# Patient Record
Sex: Female | Born: 1985 | Race: Black or African American | Hispanic: No | Marital: Married | Smoking: Never smoker
Health system: Southern US, Community
[De-identification: ages and names within clinical notes are randomized; demographics above are authoritative.]

---

## 2017-10-19 ENCOUNTER — Emergency Department (HOSPITAL_BASED_OUTPATIENT_CLINIC_OR_DEPARTMENT_OTHER)
Admission: EM | Admit: 2017-10-19 | Discharge: 2017-10-19 | Disposition: A | Discharge status: Home / Self Care | Payer: Managed Care, Other (non HMO) | Attending: Emergency Medicine | Admitting: Emergency Medicine

## 2017-10-19 ENCOUNTER — Other Ambulatory Visit: Payer: Self-pay

## 2017-10-19 ENCOUNTER — Emergency Department (HOSPITAL_BASED_OUTPATIENT_CLINIC_OR_DEPARTMENT_OTHER): Payer: Managed Care, Other (non HMO)

## 2017-10-19 ENCOUNTER — Encounter (HOSPITAL_BASED_OUTPATIENT_CLINIC_OR_DEPARTMENT_OTHER): Payer: Self-pay

## 2017-10-19 DIAGNOSIS — R109 Unspecified abdominal pain: Secondary | ICD-10-CM

## 2017-10-19 DIAGNOSIS — R51 Headache: Secondary | ICD-10-CM | POA: Diagnosis present

## 2017-10-19 DIAGNOSIS — Z91040 Latex allergy status: Secondary | ICD-10-CM | POA: Insufficient documentation

## 2017-10-19 DIAGNOSIS — R1011 Right upper quadrant pain: Secondary | ICD-10-CM | POA: Insufficient documentation

## 2017-10-19 DIAGNOSIS — G43809 Other migraine, not intractable, without status migrainosus: Secondary | ICD-10-CM

## 2017-10-19 LAB — CBC WITH DIFFERENTIAL/PLATELET
Basophils Absolute: 0 10*3/uL (ref 0.0–0.1)
Basophils Relative: 0 %
EOS ABS: 0.1 10*3/uL (ref 0.0–0.7)
EOS PCT: 1 %
HCT: 36.6 % (ref 36.0–46.0)
Hemoglobin: 12.3 g/dL (ref 12.0–15.0)
LYMPHS ABS: 1.4 10*3/uL (ref 0.7–4.0)
LYMPHS PCT: 15 %
MCH: 28.1 pg (ref 26.0–34.0)
MCHC: 33.6 g/dL (ref 30.0–36.0)
MCV: 83.8 fL (ref 78.0–100.0)
MONOS PCT: 4 %
Monocytes Absolute: 0.4 10*3/uL (ref 0.1–1.0)
Neutro Abs: 8 10*3/uL — ABNORMAL HIGH (ref 1.7–7.7)
Neutrophils Relative %: 80 %
PLATELETS: 257 10*3/uL (ref 150–400)
RBC: 4.37 MIL/uL (ref 3.87–5.11)
RDW: 13 % (ref 11.5–15.5)
WBC: 9.9 10*3/uL (ref 4.0–10.5)

## 2017-10-19 LAB — URINALYSIS, ROUTINE W REFLEX MICROSCOPIC
Bilirubin Urine: NEGATIVE
GLUCOSE, UA: NEGATIVE mg/dL
HGB URINE DIPSTICK: NEGATIVE
KETONES UR: NEGATIVE mg/dL
Leukocytes, UA: NEGATIVE
Nitrite: NEGATIVE
PROTEIN: NEGATIVE mg/dL
Specific Gravity, Urine: 1.02 (ref 1.005–1.030)
pH: 6.5 (ref 5.0–8.0)

## 2017-10-19 LAB — COMPREHENSIVE METABOLIC PANEL
ALK PHOS: 63 U/L (ref 38–126)
ALT: 17 U/L (ref 14–54)
ANION GAP: 7 (ref 5–15)
AST: 24 U/L (ref 15–41)
Albumin: 3.9 g/dL (ref 3.5–5.0)
BUN: 14 mg/dL (ref 6–20)
CALCIUM: 8.9 mg/dL (ref 8.9–10.3)
CHLORIDE: 106 mmol/L (ref 101–111)
CO2: 23 mmol/L (ref 22–32)
Creatinine, Ser: 0.72 mg/dL (ref 0.44–1.00)
Glucose, Bld: 96 mg/dL (ref 65–99)
Potassium: 4.3 mmol/L (ref 3.5–5.1)
SODIUM: 136 mmol/L (ref 135–145)
Total Bilirubin: 0.6 mg/dL (ref 0.3–1.2)
Total Protein: 8.2 g/dL — ABNORMAL HIGH (ref 6.5–8.1)

## 2017-10-19 LAB — PREGNANCY, URINE: PREG TEST UR: NEGATIVE

## 2017-10-19 LAB — LIPASE, BLOOD: LIPASE: 32 U/L (ref 11–51)

## 2017-10-19 MED ORDER — PROCHLORPERAZINE EDISYLATE 10 MG/2ML IJ SOLN
10.0000 mg | Freq: Once | INTRAMUSCULAR | Status: DC
  Filled 2017-10-19: qty 2

## 2017-10-19 MED ORDER — DEXAMETHASONE SODIUM PHOSPHATE 10 MG/ML IJ SOLN
10.0000 mg | Freq: Once | INTRAMUSCULAR | Status: AC
  Administered 2017-10-19: 10 mg via INTRAVENOUS
  Filled 2017-10-19 (×2): qty 1

## 2017-10-19 MED ORDER — SODIUM CHLORIDE 0.9 % IV BOLUS
1000.0000 mL | Freq: Once | INTRAVENOUS | Status: AC
Start: 2017-10-19 — End: 2017-10-19
  Administered 2017-10-19: 1000 mL via INTRAVENOUS

## 2017-10-19 MED ORDER — DIPHENHYDRAMINE HCL 50 MG/ML IJ SOLN
25.0000 mg | Freq: Once | INTRAMUSCULAR | Status: DC
  Filled 2017-10-19: qty 1

## 2017-10-19 MED ORDER — ONDANSETRON HCL 4 MG/2ML IJ SOLN
4.0000 mg | Freq: Once | INTRAMUSCULAR | Status: AC
  Administered 2017-10-19: 4 mg via INTRAVENOUS
  Filled 2017-10-19: qty 2

## 2017-10-19 MED ORDER — ACETAMINOPHEN 500 MG PO TABS
1000.0000 mg | ORAL_TABLET | Freq: Once | ORAL | Status: AC
  Administered 2017-10-19: 1000 mg via ORAL
  Filled 2017-10-19: qty 2

## 2017-10-19 MED ORDER — ONDANSETRON 8 MG PO TBDP: 8.0000 mg | ORAL_TABLET | Freq: Three times a day (TID) | ORAL | 0 refills | Status: AC | PRN

## 2017-10-19 NOTE — ED Provider Notes (Signed)
[facility] EMERGENCY DEPARTMENT Provider Note   CSN: [id number] Arrival date & time: 10/19/17  1545     History   Chief Complaint Chief Complaint  Patient presents with  . Migraine    HPI Jo Obrien is a 32 y.o. female.  HPI Jo Obrien is a 32 y.o. female presents to emergency department complaining of a headache.  Patient states that she started having a typical for her migraine yesterday.  States took Tylenol which did not help.  This morning she went and while at work, developed right upper quadrant abdominal pain, nausea, vomiting.  She states she is still having a headache as well.  No medications taken prior to coming in today.  EMS had to be called to transport patient here.  Denies any vaginal discharge or bleeding.  Denies any urinary symptoms.  No fever or chills.  States that she is having generalized weakness and generalized malaise.  History reviewed. No pertinent past medical history.  There are no active problems to display for this patient.   History reviewed. No pertinent surgical history.   OB History   None      Home Medications    Prior to Admission medications   Not on File    Family History No family history on file.  Social History Social History   Tobacco Use  . Smoking status: Never Smoker  . Smokeless tobacco: Never Used  Substance Use Topics  . Alcohol use: Not Currently  . Drug use: Not Currently     Allergies   Ibuprofen; Latex; and Sulfa antibiotics   Review of Systems Review of Systems  Constitutional: Negative for chills and fever.  Respiratory: Negative for cough, chest tightness and shortness of breath.   Cardiovascular: Negative for chest pain, palpitations and leg swelling.  Gastrointestinal: Positive for abdominal pain, nausea and vomiting. Negative for diarrhea.  Genitourinary: Negative for dysuria, flank pain, pelvic pain, vaginal bleeding, vaginal discharge and vaginal pain.  Musculoskeletal:  Negative for arthralgias, myalgias, neck pain and neck stiffness.  Skin: Negative for rash.  Neurological: Positive for headaches. Negative for dizziness and weakness.  All other systems reviewed and are negative.    Physical Exam Updated Vital Signs BP 127/89 (BP Location: Right Arm)   Pulse 86   Temp 98.1 F (36.7 C) (Oral)   Resp 18   Ht 5\' 4"  (1.626 m)   Wt 104.3 kg (230 lb)   SpO2 99%   BMI 39.48 kg/m   Physical Exam  Constitutional: She is oriented to person, place, and time. She appears well-developed and well-nourished. No distress.  HENT:  Head: Normocephalic and atraumatic.  Eyes: Pupils are equal, round, and reactive to light. Conjunctivae and EOM are normal.  Neck: Normal range of motion. Neck supple.  Cardiovascular: Normal rate, regular rhythm and normal heart sounds.  Pulmonary/Chest: Effort normal and breath sounds normal. No respiratory distress. She has no wheezes. She has no rales.  Abdominal: Soft. Bowel sounds are normal. She exhibits no distension. There is tenderness. There is no rebound.  Diffuse tenderness, worse in the right upper quadrant.  Musculoskeletal: Normal range of motion. She exhibits no edema.  Neurological: She is alert and oriented to person, place, and time. No cranial nerve deficit. Coordination normal.  Skin: Skin is warm and dry.  Psychiatric: She has a normal mood and affect. Her behavior is normal.  Nursing note and vitals reviewed.    ED Treatments / Results  Labs (all labs ordered are listed,  but only abnormal results are displayed) Labs Reviewed  CBC WITH DIFFERENTIAL/PLATELET - Abnormal; Notable for the following components:      Result Value   Neutro Abs 8.0 (*)    All other components within normal limits  COMPREHENSIVE METABOLIC PANEL - Abnormal; Notable for the following components:   Total Protein 8.2 (*)    All other components within normal limits  URINALYSIS, ROUTINE W REFLEX MICROSCOPIC - Abnormal; Notable for  the following components:   APPearance CLOUDY (*)    All other components within normal limits  LIPASE, BLOOD  PREGNANCY, URINE    EKG None  Radiology US Abdomen Limited Ruq  Result Date: 10/19/2017 CLINICAL DATA:  Right upper quadrant pain EXAM: ULTRASOUND ABDOMEN LIMITED RIGHT UPPER QUADRANT COMPARISON:  None. FINDINGS: Gallbladder: No gallstones or wall thickening visualized. No sonographic Murphy sign noted by sonographer. Common bile duct: Diameter: 2 mm Liver: No focal lesion identified. Within normal limits in parenchymal echogenicity. Portal vein is patent on color Doppler imaging with normal direction of blood flow towards the liver. IMPRESSION: No acute abnormality noted. Electronically Signed   By: Alcide Clever M.D.   On: 10/19/2017 19:29    Procedures Procedures (including critical care time)  Medications Ordered in ED Medications  prochlorperazine (COMPAZINE) injection 10 mg (has no administration in time range)  diphenhydrAMINE (BENADRYL) injection 25 mg (has no administration in time range)  dexamethasone (DECADRON) injection 10 mg (has no administration in time range)  sodium chloride 0.9 % bolus 1,000 mL (has no administration in time range)     Initial Impression / Assessment and Plan / ED Course  I have reviewed the triage vital signs and the nursing notes.  Pertinent labs & imaging results that were available during my care of the patient were reviewed by me and considered in my medical decision making (see chart for details).     Patient in emergency department with headache, states similar to prior migraines, also complaining of right upper quadrant abdominal pain, nausea, vomiting.  Vital signs are normal, afebrile, no acute distress.  Abdomen is diffusely tender, worse in the right upper quadrant.  Will check labs, give migraine cocktail, give fluids.  Will reassess.  Patient feels much better.  Headache has resolved.  Continue to have right upper quadrant  abdominal pain, ultrasound was obtained to rule out cholecystitis and is negative.  Normal LFTs and lipase.  Normal WBC.  Question whether abdominal pain could be from vomiting which could be caused by migraine headache.  Patient is feels much better, vital signs all within normal, will discharge home.  We will have her follow-up with family doctor.  Advised to take Tylenol or Excedrin Migraine for headache.  Return precautions discussed.  Vitals:   10/19/17 1557 10/19/17 1759 10/19/17 1841 10/19/17 1955  BP: 127/89 (!) 131/92 120/79 120/72  Pulse: 86 84 76 83  Resp: 18 18 16 16   Temp: 98.1 F (36.7 C)     TempSrc: Oral     SpO2: 99% 99% 96% 99%  Weight: 104.3 kg (230 lb)     Height: 5\' 4"  (1.626 m)        Final Clinical Impressions(s) / ED Diagnoses   Final diagnoses:  Abdominal pain  Other migraine without status migrainosus, not intractable    ED Discharge Orders        Ordered    ondansetron (ZOFRAN ODT) 8 MG disintegrating tablet  Every 8 hours PRN     10/19/17 1950  Jaynie Crumble, PA-C 10/19/17 2302    Maia Plan, MD 10/20/17 1103

## 2017-10-19 NOTE — ED Notes (Signed)
Pt refused medications at this time and stated that they made her feel like she was floating.  Angelique Holm. Kirichenko, PA notified.

## 2017-10-19 NOTE — ED Triage Notes (Signed)
Per EMS pt developed a migraine headache last night and this morning at work she became nausea and vomit x1 from the headache

## 2017-10-19 NOTE — Discharge Instructions (Addendum)
Zofran as prescribed as needed for nausea.  Your blood work and ultrasound today is reassuring.  Please follow-up with your doctor as needed.

## 2020-02-24 IMAGING — US US ABDOMEN LIMITED
1 series · 14 of 25 positions shown · non-contrast
Comparison: None.

CLINICAL DATA: Right upper quadrant pain

EXAM:
ULTRASOUND ABDOMEN LIMITED RIGHT UPPER QUADRANT

[Series 1: us abdomen limited · 0.18mm/px · 14 of 27 slices shown]
[im 1/27]
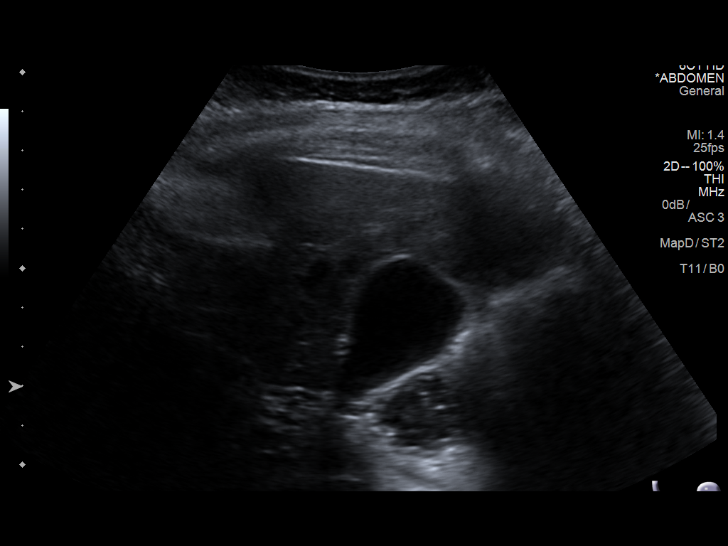
[im 3/27]
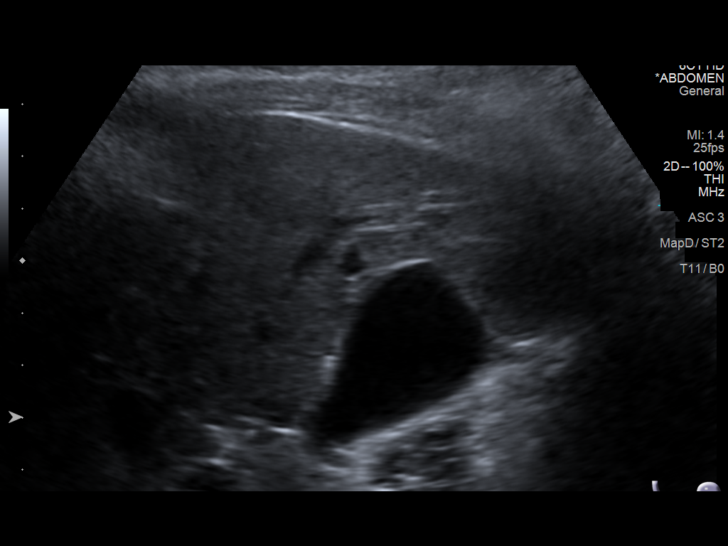
[im 5/27]
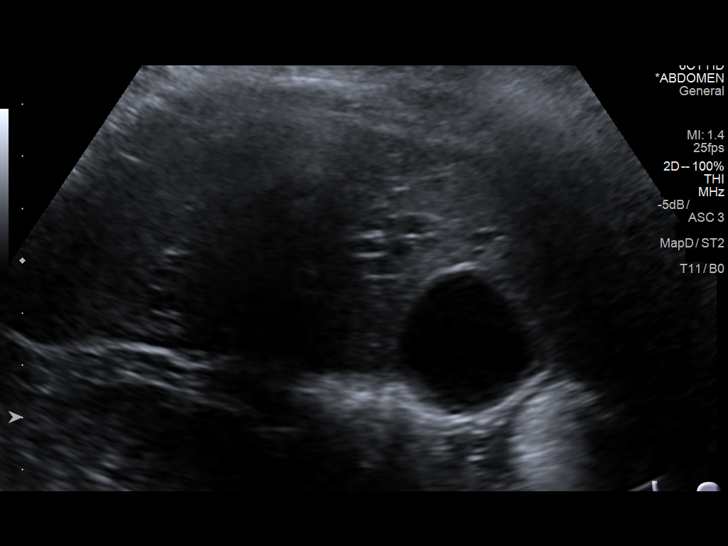
[im 7/27]
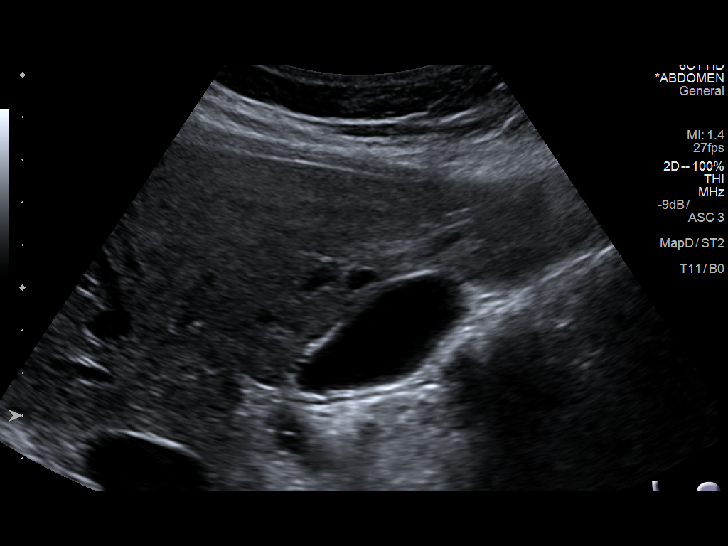
[im 9/27]
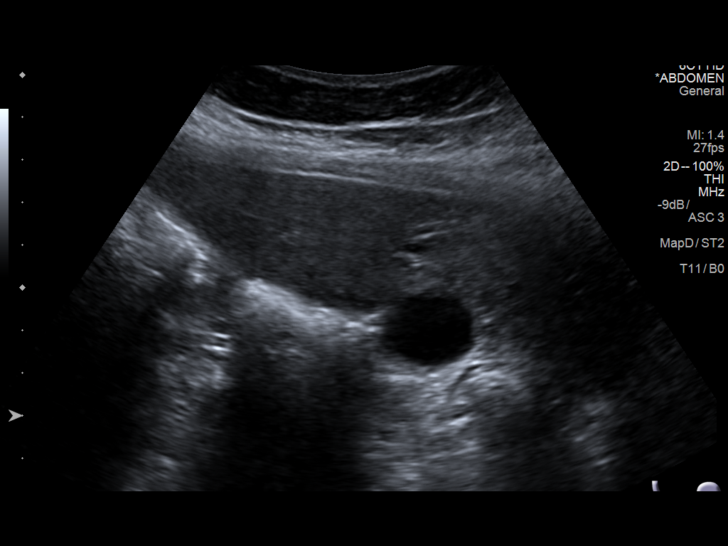
[im 10/27]
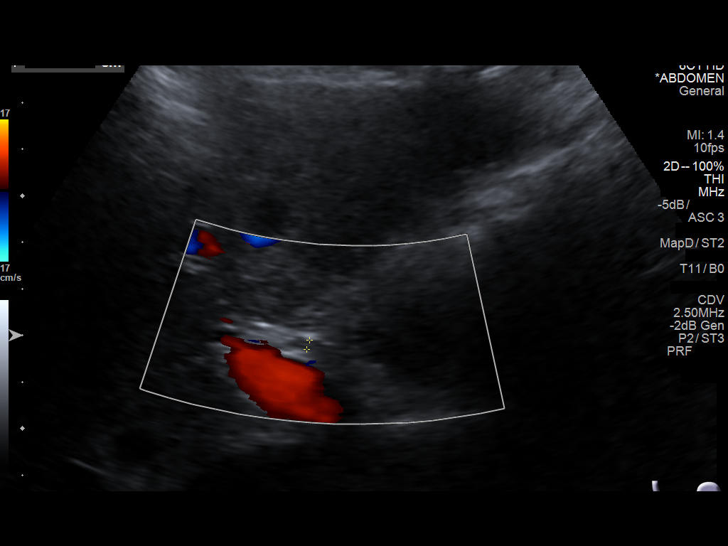
[im 12/27]
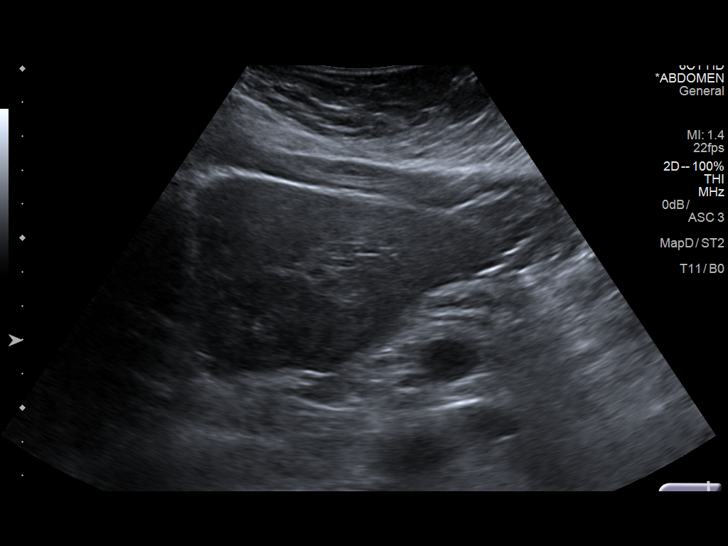
[im 15/27]
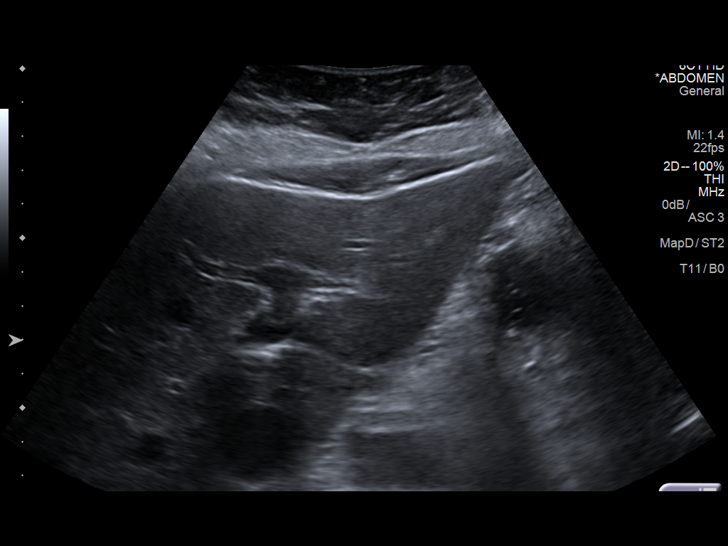
[im 17/27]
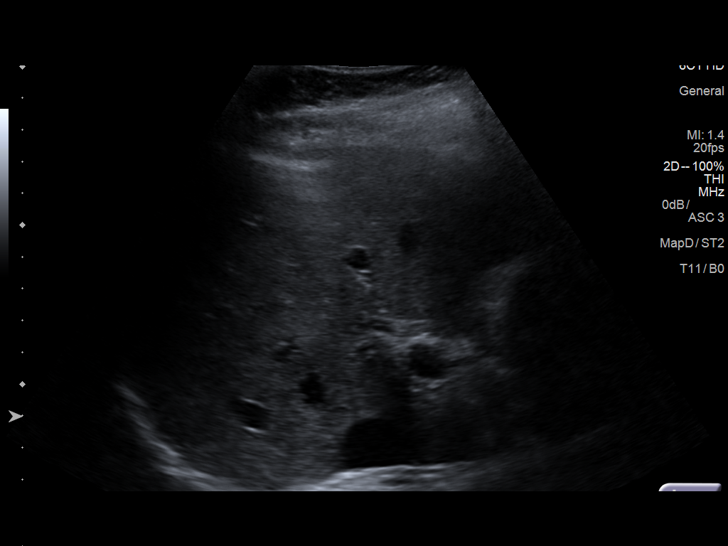
[im 18/27]
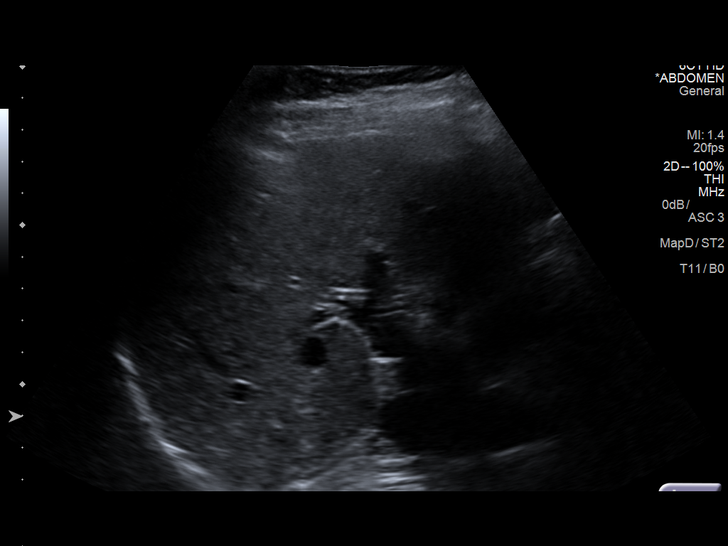
[im 20/27]
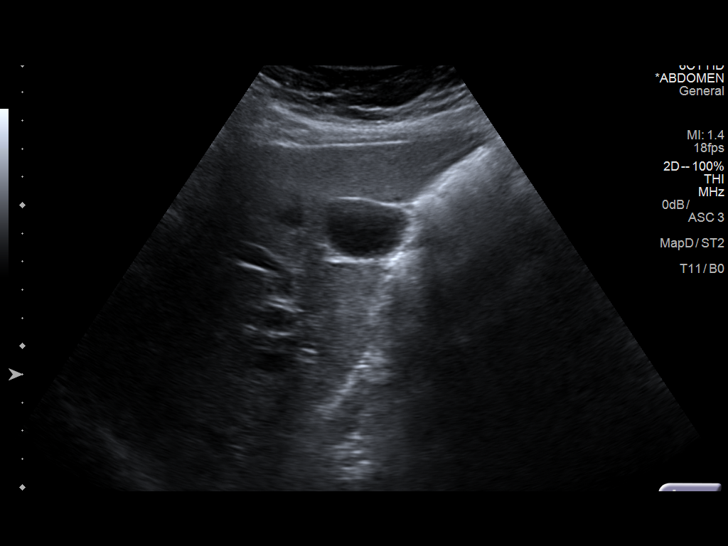
[im 22/27]
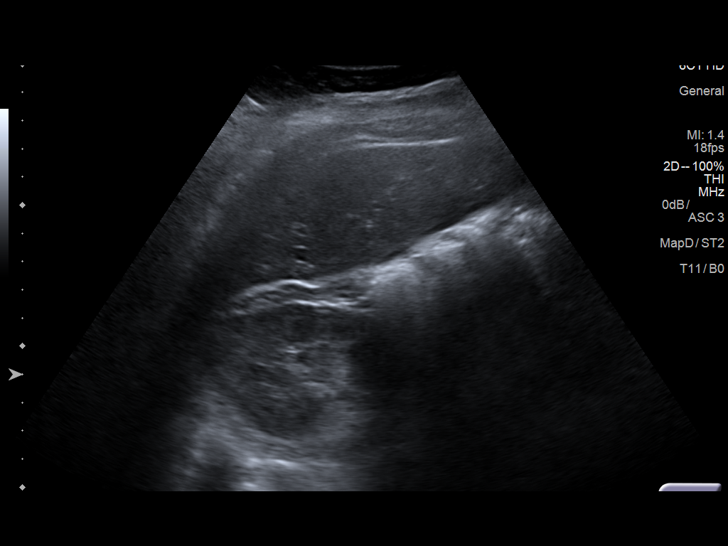
[im 24/27]
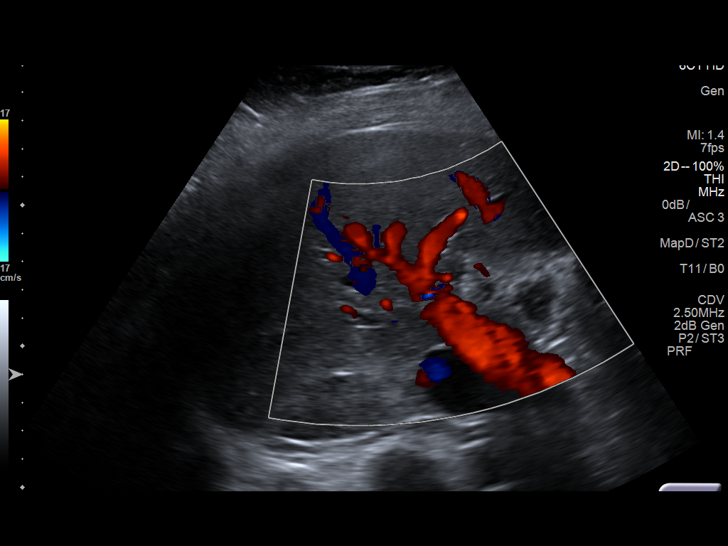
[im 27/27]
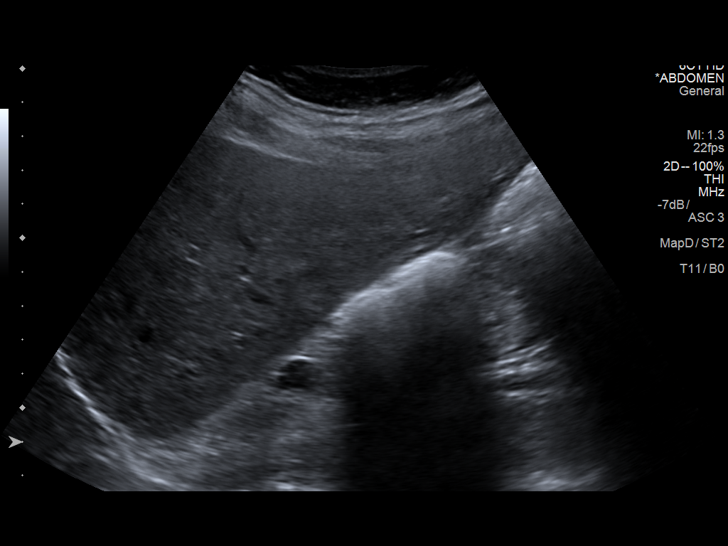

[14 of 25 positions shown; findings below may reference images not displayed]

FINDINGS: Gallbladder:

No gallstones or wall thickening visualized. No sonographic Murphy
sign noted by sonographer.

Common bile duct:

Diameter: 2 mm

Liver:

No focal lesion identified. Within normal limits in parenchymal
echogenicity. Portal vein is patent on color Doppler imaging with
normal direction of blood flow towards the liver.
IMPRESSION: No acute abnormality noted.
